# Patient Record
Sex: Male | Born: 1955 | Race: White | Hispanic: No
Health system: Southern US, Community
[De-identification: ages and names within clinical notes are randomized; demographics above are authoritative.]

---

## 2014-02-15 ENCOUNTER — Inpatient Hospital Stay: Payer: Self-pay | Admitting: Internal Medicine

## 2014-02-15 LAB — COMPREHENSIVE METABOLIC PANEL
ALK PHOS: 82 U/L
ANION GAP: 9 (ref 7–16)
AST: 35 U/L (ref 15–37)
Albumin: 4.3 g/dL (ref 3.4–5.0)
BUN: 11 mg/dL (ref 7–18)
Bilirubin,Total: 1 mg/dL (ref 0.2–1.0)
Calcium, Total: 9.3 mg/dL (ref 8.5–10.1)
Chloride: 99 mmol/L (ref 98–107)
Co2: 26 mmol/L (ref 21–32)
Creatinine: 0.94 mg/dL (ref 0.60–1.30)
EGFR (Non-African Amer.): >60
Glucose: 94 mg/dL (ref 65–99)
Osmolality: 267 (ref 275–301)
Potassium: 3.5 mmol/L (ref 3.5–5.1)
SGPT (ALT): 30 U/L (ref 12–78)
SODIUM: 134 mmol/L — AB (ref 136–145)
TOTAL PROTEIN: 8.5 g/dL — AB (ref 6.4–8.2)

## 2014-02-15 LAB — CBC
HCT: 47.1 % (ref 40.0–52.0)
HGB: 16 g/dL (ref 13.0–18.0)
MCH: 35 pg — ABNORMAL HIGH (ref 26.0–34.0)
MCHC: 33.9 g/dL (ref 32.0–36.0)
MCV: 103 fL — ABNORMAL HIGH (ref 80–100)
PLATELETS: 234 10*3/uL (ref 150–440)
RBC: 4.57 10*6/uL (ref 4.40–5.90)
RDW: 14.5 % (ref 11.5–14.5)
WBC: 4.5 10*3/uL (ref 3.8–10.6)

## 2014-02-15 LAB — LIPASE, BLOOD: Lipase: 196 U/L (ref 73–393)

## 2014-02-15 LAB — TROPONIN I

## 2014-02-15 LAB — APTT: Activated PTT: 25.6 secs (ref 23.6–35.9)

## 2014-02-15 LAB — PROTIME-INR
INR: 0.9
PROTHROMBIN TIME: 12.4 s (ref 11.5–14.7)

## 2014-02-16 LAB — CBC WITH DIFFERENTIAL/PLATELET
BASOS ABS: 0 10*3/uL (ref 0.0–0.1)
Basophil %: 0 %
EOS ABS: 0.3 10*3/uL (ref 0.0–0.7)
Eosinophil %: 2.1 %
HCT: 38.5 % — AB (ref 40.0–52.0)
HGB: 12.6 g/dL — ABNORMAL LOW (ref 13.0–18.0)
LYMPHS ABS: 0.7 10*3/uL — AB (ref 1.0–3.6)
Lymphocyte %: 5.1 %
MCH: 33.7 pg (ref 26.0–34.0)
MCHC: 32.7 g/dL (ref 32.0–36.0)
MCV: 103 fL — AB (ref 80–100)
Monocyte #: 0.3 x10 3/mm (ref 0.2–1.0)
Monocyte %: 2.1 %
Neutrophil #: 12.9 10*3/uL — ABNORMAL HIGH (ref 1.4–6.5)
Neutrophil %: 90.7 %
Platelet: 164 10*3/uL (ref 150–440)
RBC: 3.74 10*6/uL — AB (ref 4.40–5.90)
RDW: 14.2 % (ref 11.5–14.5)
WBC: 14.2 10*3/uL — ABNORMAL HIGH (ref 3.8–10.6)

## 2014-02-16 LAB — HEPARIN LEVEL (UNFRACTIONATED)
Anti-Xa(Unfractionated): <0.1 IU/mL — ABNORMAL LOW (ref 0.30–0.70)
Anti-Xa(Unfractionated): <0.1 IU/mL — ABNORMAL LOW (ref 0.30–0.70)

## 2014-02-16 LAB — BASIC METABOLIC PANEL
Anion Gap: 7 (ref 7–16)
BUN: 12 mg/dL (ref 7–18)
Calcium, Total: 8.2 mg/dL — ABNORMAL LOW (ref 8.5–10.1)
Chloride: 107 mmol/L (ref 98–107)
Co2: 23 mmol/L (ref 21–32)
Creatinine: 1.01 mg/dL (ref 0.60–1.30)
EGFR (African American): >60
EGFR (Non-African Amer.): >60
GLUCOSE: 86 mg/dL (ref 65–99)
Osmolality: 273 (ref 275–301)
Potassium: 3.9 mmol/L (ref 3.5–5.1)
SODIUM: 137 mmol/L (ref 136–145)

## 2014-02-16 LAB — APTT: Activated PTT: 32.4 secs (ref 23.6–35.9)

## 2014-02-17 LAB — HEPARIN LEVEL (UNFRACTIONATED)
ANTI-XA(UNFRACTIONATED): 0.39 [IU]/mL (ref 0.30–0.70)
Anti-Xa(Unfractionated): 0.14 IU/mL — ABNORMAL LOW (ref 0.30–0.70)

## 2014-02-18 LAB — CBC WITH DIFFERENTIAL/PLATELET
BASOS PCT: 0.2 %
Basophil #: 0 10*3/uL (ref 0.0–0.1)
Eosinophil #: 0.5 10*3/uL (ref 0.0–0.7)
Eosinophil %: 7.5 %
HCT: 39.3 % — AB (ref 40.0–52.0)
HGB: 13.6 g/dL (ref 13.0–18.0)
Lymphocyte #: 1.2 10*3/uL (ref 1.0–3.6)
Lymphocyte %: 16.7 %
MCH: 35.3 pg — AB (ref 26.0–34.0)
MCHC: 34.6 g/dL (ref 32.0–36.0)
MCV: 102 fL — ABNORMAL HIGH (ref 80–100)
MONO ABS: 0.4 x10 3/mm (ref 0.2–1.0)
MONOS PCT: 6.4 %
NEUTROS PCT: 69.2 %
Neutrophil #: 4.9 10*3/uL (ref 1.4–6.5)
PLATELETS: 143 10*3/uL — AB (ref 150–440)
RBC: 3.85 10*6/uL — ABNORMAL LOW (ref 4.40–5.90)
RDW: 14.3 % (ref 11.5–14.5)
WBC: 7 10*3/uL (ref 3.8–10.6)

## 2014-02-18 LAB — HEPARIN LEVEL (UNFRACTIONATED): Anti-Xa(Unfractionated): 0.23 IU/mL — ABNORMAL LOW (ref 0.30–0.70)

## 2014-02-19 LAB — HEPARIN LEVEL (UNFRACTIONATED)
Anti-Xa(Unfractionated): 0.25 IU/mL — ABNORMAL LOW (ref 0.30–0.70)
Anti-Xa(Unfractionated): 0.52 IU/mL (ref 0.30–0.70)

## 2014-02-19 LAB — APTT: ACTIVATED PTT: 72.7 s — AB (ref 23.6–35.9)

## 2014-02-20 LAB — CULTURE, BLOOD (SINGLE)

## 2014-12-12 NOTE — Op Note (Signed)
PATIENT NAME:  Joe Hopkins, Bandy MR#:  [id number] DATE OF BIRTH:  [birth year]  DATE OF PROCEDURE:  02/18/2014  PREOPERATIVE DIAGNOSES:  1.  Atherosclerotic occlusive disease, right renal artery.  2.  Infarction, lower pole, right renal artery secondary to atherosclerotic occlusive disease, right renal artery.  3.  Hypertension.   POSTOPERATIVE DIAGNOSES: 1.  Atherosclerotic occlusive disease, right renal artery.  2.  Infarction, lower pole, right renal artery secondary to atherosclerotic occlusive disease, right renal artery.  3.  Hypertension.   PROCEDURES PERFORMED:  1.  Abdominal aortogram.  2.  Right renal angiography, first-order catheter placement.  3.  Percutaneous transluminal angioplasty and stent placement, right renal artery.   SURGEON: Levora DredgeGregory Schnier, M.D.   SEDATION:  Versed 5 mg plus fentanyl 200 mcg administered IV. Continuous ECG, pulse oximetry, and cardiopulmonary monitoring performed throughout the entire procedure by the interventional radiology nurse. Total sedation time was 1 hour.   ACCESS: Left common femoral artery. A 6 French sheath.   CONTRAST USED: Isovue 75 mL.   FLUOROSCOPY TIME: 5.3 minutes.   INDICATIONS: [name] presented to the hospital with flank pain and was found to have infarction of his right lower pole of the kidney.  He was also noted on that study to have a high-grade atherosclerotic lesion at the origin of the renal artery. He is, therefore, undergoing treatment to prevent further damage to his kidney. Risks and benefits are reviewed. All questions answered. The patient agrees to proceed.   DESCRIPTION OF PROCEDURE: The patient is taken to the special procedure suite, placed in the supine position. After adequate sedation is achieved, the groins are prepped and draped in a sterile fashion. Ultrasound is placed in a sterile sleeve. Ultrasound utilized secondary to lack of appropriate landmarks and for vascular injury. Under direct ultrasound  visualization, the common femoral artery on the left is identified. It is echolucent and pulsatile indicating patency. Image is recorded for the permanent record, and a micropuncture needle is then used to access it after 1% lidocaine has been infiltrated into the soft tissues. Microwire followed by micro sheath, J-wire followed by a 5 French sheath. Pigtail catheter and J-wire then advanced up to the level of T12 and AP projection of the aorta is obtained. Right renal artery is identified.  High-grade stricture is noted and 4000 units of heparin given. The sheath is then upsized to a 6 [place] Ansell and a VS1 catheter is then advanced over the J-wire. J-wire is then exchanged for a Magic torque wire using the VS1 catheter and the Magic torque wire of the renal artery is selected. Angiography  in magnified views is then obtained more clearly delineating the narrowing, and the renal artery is then measured and it is reported as approximately 6 mm in diameter.   An 0.018 wire is then exchanged through VS1. The sheath is advanced up to the ostia and a 6 x 15 balloon expandable stent is prepped on the field and advanced over the wire.  It is then positioned so that approximately 2 mm are protruding into the aorta.  Several pictures are taken and magnified imaging to properly position the stent and then stent is deployed to 12 atmospheres for approximately 30 seconds. Follow-up imaging demonstrates the stent is very well positioned. It shows good apposition to the walls of the artery, and indeed does protrude approximately 2 mm into the aorta. Distally, the renal parenchyma fills nicely.   The wire is then removed. The sheath is pulled into the  external iliac. Oblique views are then obtained and a StarClose device deployed, and there were no immediate complications.   INTERPRETATION: Initial views of the renal artery on the right demonstrate a high-grade 70% to 80% lesion at the ostia, otherwise, the renal artery  on the right appears normal.  The left renal artery ostia is widely patent. Following angioplasty with stent placement as described above, with dilatation at 6 mm, there is near total resolution of the lesion.   SUMMARY: Successful renal artery angioplasty and stent placement as described above.    ____________________________ Renford Dills, MD ggs:ts D: 02/27/2014 13:45:27 ET T: 02/27/2014 14:18:35 ET JOB#: [id number]  cc: Renford Dills, MD, <Dictator> Renford Dills MD ELECTRONICALLY SIGNED 03/17/2014 12:20

## 2014-12-12 NOTE — H&P (Signed)
Subjective/Chief Complaint back pain   History of Present Illness 59 year old male with no known chronic medical conditions other than alcohol and tobacco abuse presents with sudden onset of severe back pain that started today. Patient states that he recently moved from [place]. He was hospitalized there 2 weeks ago for septic shock, bacteremia and subsequent arrest from an infection in his left toe that eventually required amputation. He states he does not know which hospital he was in at the time but on discharge he was asked to complete a course of antibiotics Clindamycin and Bactrim. He does not know if any bacteria were isolated in his blood. Today he woke up with severe, sharp, diffuse back pain that radiates up to his neck and extends all the way down to his buttock. It gets worse with movement, no alleviating factors. In ED CT showed renal infarct and he is tachycardic so hospitalist group is called for admission.  ROS Gen: no fever, chills, fatigue, weightloss Eyes: No eye pain, no blurred vision ENT: no ear pain, no tinnitus, no sorethroat Cardiac: no chest pain, no palpitations, no leg swelling Pulm: no cough, no dyspnea GI: no abdominal pain, no constipation, no diarrhea Skin: no new rashes Musc: no myalgia, no arthralgias Neuro: no headaches, no dizziness, no lightheadedness Psych: denies depression, suicidal or homicidal ideation   Past History Recent hospitalization for sepsis, toe infection and amputation Tobacco abuse, continuous Chronic daily alcohol use, continuous   Primary Physician None   ALLERGIES:  No Known Allergies:   HOME MEDICATIONS: Medication Instructions Status  clindamycin 300 mg oral capsule  orally every 8 hours Active  sulfamethoxazole-trimethoprim 800 mg-160 mg oral tablet 1 tab(s) orally 2 times a day Active   Family and Social History:  Family History Coronary Artery Disease  Hypertension  Diabetes Mellitus  Stroke  ESRD on HD father   Social  History positive  tobacco, positive ETOH, 5-7 beers daily, last drink today 2 beers. denies hx of withdrawal seizures   + Tobacco Current (within 1 year)  1PPD for 45 years   Review of Systems:  Fever/Chills No   Cough No   Sputum No   Abdominal Pain No   Diarrhea No   Constipation No   Nausea/Vomiting No   SOB/DOE No   Chest Pain No   Telemetry Reviewed sinus tachycardia   Dysuria No   Physical Exam:  GEN no acute distress, thin   HEENT PERRL, hearing intact to voice, moist oral mucosa, Oropharynx clear   NECK supple  No masses   RESP normal resp effort  clear BS   CARD No LE edema  no JVD  tachycardic   ABD denies tenderness  soft  normal BS   EXTR negative cyanosis/clubbing, negative edema, normal tone. 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, wound healed, no erythema   SKIN normal to palpation, No rashes, warm and dry   NEURO cranial nerves intact, follows commands, motor/sensory function intact   PSYCH alert, A+O to time, place, person, good insight   Lab Results:  Hepatic:  28-Jun-15 14:24   Bilirubin, Total 1.0  Alkaline Phosphatase 82 (45-117 NOTE: New Reference Range 07/11/13)  SGPT (ALT) 30  SGOT (AST) 35  Total Protein, Serum  8.5  Albumin, Serum 4.3  Routine Chem:  28-Jun-15 14:24   Glucose, Serum 94  BUN 11  Creatinine (comp) 0.94  Sodium, Serum  134  Potassium, Serum 3.5  Chloride, Serum 99  CO2, Serum 26  Calcium (Total), Serum 9.3  Osmolality (calc) 267  eGFR (African American) >60  eGFR (Non-African American) >60 (eGFR values <55m/min/1.73 m2 may be an indication of chronic kidney disease (CKD). Calculated eGFR is useful in patients with stable renal function. The eGFR calculation will not be reliable in acutely ill patients when serum creatinine is changing rapidly. It is not useful in  patients on dialysis. The eGFR calculation may not be applicable to patients at the low and high extremes of body sizes, pregnant women, and  vegetarians.)  Anion Gap 9  Lipase 196 (Result(s) reported on 15 Feb 2014 at 02:59PM.)  Cardiac:  28-Jun-15 14:24   Troponin I < 0.02 (0.00-0.05 0.05 ng/mL or less: NEGATIVE  Repeat testing in 3-6 hrs  if clinically indicated. >0.05 ng/mL: POTENTIAL  MYOCARDIAL INJURY. Repeat  testing in 3-6 hrs if  clinically indicated. NOTE: An increase or decrease  of 30% or more on serial  testing suggests a  clinically important change)  Routine Coag:  28-Jun-15 14:24   Prothrombin 12.4  INR 0.9 (INR reference interval applies to patients on anticoagulant therapy. A single INR therapeutic range for coumarins is not optimal for all indications; however, the suggested range for most indications is 2.0 - 3.0. Exceptions to the INR Reference Range may include: Prosthetic heart valves, acute myocardial infarction, prevention of myocardial infarction, and combinations of aspirin and anticoagulant. The need for a higher or lower target INR must be assessed individually. Reference: The Pharmacology and Management of the Vitamin K  antagonists: the seventh ACCP Conference on Antithrombotic and Thrombolytic Therapy. CGBTDV.7616Sept:126 (3suppl): [id number] A HCT value >55% may artifactually increase the PT.  In one study,  the increase was an average of 25%. Reference:  "Effect on Routine and Special Coagulation Testing Values of Citrate Anticoagulant Adjustment in Patients with High HCT Values." American Journal of Clinical Pathology 2006;126:400-405.)  Activated PTT (APTT) 25.6 (A HCT value >55% may artifactually increase the APTT. In one study, the increase was an average of 19%. Reference: "Effect on Routine and Special Coagulation Testing Values of Citrate Anticoagulant Adjustment in Patients with High HCT Values." American Journal of Clinical Pathology 2006;126:400-405.)  Routine Hem:  28-Jun-15 14:24   WBC (CBC) 4.5  RBC (CBC) 4.57  Hemoglobin (CBC) 16.0  Hematocrit (CBC) 47.1   Platelet Count (CBC) 234 (Result(s) reported on 15 Feb 2014 at 02:52PM.)  MCV  103  MCH  35.0  MCHC 33.9  RDW 14.5   Radiology Results: XRay:    28-Jun-15 14:59, Chest Portable Single View  Chest Portable Single View  REASON FOR EXAM:    Chest Pain  COMMENTS:       PROCEDURE: DXR - DXR PORTABLE CHEST SINGLE VIEW  - Feb 15 2014  2:59PM     CLINICAL DATA:  Back pain, sepsis    EXAM:  PORTABLE CHEST - 1 VIEW    COMPARISON:  None.    FINDINGS:  Cardiomediastinal silhouette is unremarkable. No acute infiltrate or  pulmonary edema. There is vague nodular density in left base  measures about 2 cm. Although this may be due to prior rib fracture,  a lung nodule cannot be excluded. Further correlation with CT scan  is recommended.     IMPRESSION:  No acute infiltrate or pulmonary edema. Vague nodular density left  base. A lung nodule cannot be excluded. Further evaluation with CT  scan of the chest is recommended.      Electronically Signed    By: LLahoma CrockerM.D.  On: 02/15/2014  15:15       Verified By: Ephraim Hamburger, M.D.,  CT:    28-Jun-15 16:28, CT Angiography Chest/Abd/Pelvis w/wo  CT Angiography Chest/Abd/Pelvis w/wo  REASON FOR EXAM:    severe b/l shoulder pain and upper back pain rad.   down to lower back, tachycardi  COMMENTS:       PROCEDURE: CT  - CT ANGIOGRAPHY CHEST/ABD/PELVIS  - Feb 15 2014  4:28PM     CLINICAL DATA:  Severe bilateral shoulder and upper back pain  radiating down to the lower back. Tachycardia. Recently discharged  from another hospital 2 weeks ago for sepsis and cardiac arrest.    EXAM:  CT ANGIOGRAPHY CHEST, ABDOMEN AND PELVIS    TECHNIQUE:  Multidetector CT imaging through the chest,abdomen and pelvis was  performed using the standard protocol during bolus administration of  intravenous contrast. Multiplanar reconstructed images and MIPs were  obtained and reviewed to evaluate the vascular anatomy.    CONTRAST:  100 mL Isovue  370    COMPARISON:  Chest radiograph earlier today    FINDINGS:  CTA CHEST FINDINGS    There is marked motion artifact through the aortic root and  ascending aorta. There is no evidence of thoracic aortic intramural  hematoma. No definite ascending aortic dissection is seen, however  examination is nearly nondiagnostic through the proximal ascending  aorta due to artifact and a dissection through this area cannot be  excluded. Second anterior contour lying along the anterior margin of  the aortic root and ascending aorta is felt to most likely be  artifactual, however a dissection cannot be excluded due to motion  artifact. There is no gross periaortic stranding or hematoma  identified. The aortic arch and descending thoracic aorta at are  without evidence of dissection. The aorta is normal in caliber.  Moderate aortic arch atherosclerotic calcification is noted.    The heart is normal in size. 3 vessel coronary artery calcification  is noted. No enlarged axillary, mediastinal, or hilar lymph nodes  are identified. There is no pleural or pericardial effusion. Minimal  dependent subsegmental atelectasis is present in the lower lobes,  with minimal atelectasis versus scarring in the right middle lobe  and lingula. There is a minimally displaced fracture of the left  posterior tenth rib with some callus formation. There is also a  healing left anterior fifth rib fracture.    Review of the MIP images confirms the above findings.    CTA ABDOMEN AND PELVIS FINDINGS    Abdominal aorta is normal in caliber without evidence of dissection  or aneurysm. There is advanced atherosclerosis with calcified and  noncalcified atheromatous plaque throughout its length. Focal,  severe calcification is noted at the right renal artery origin, and  there is abnormally diminished enhancement of the lower pole of the  right kidney. The celiac, superior mesenteric, and bilateral renal  arteries appear  patent. Inferior mesenteric artery appears patent,  although its origin is not well evaluated. Moderate iliac and  proximal femoral atherosclerosis is noted, with the vessels  appearing patent.    A nonobstructing 4 mm calculus is present in the lower pole of the  left kidney. The liver, spleen, left adrenal gland, and pancreas are  unremarkable for arterial phase contrast timing. 8 mm right adrenal  nodule is too small to characterize. Focal wall thickening is noted  at the gallbladder fundus.    The stomach is moderately distended with fluid. Small and large  bowel are grossly  unremarkable aside from a moderate amount of stool  throughout the colon. The bladder is unremarkable. No free fluid or  enlarged lymph nodes are identified. Mild to moderate thoracolumbar  spondylosis is noted.  Review of the MIP images confirms the above findings.     IMPRESSION:  1. No evidence of dissection or aneurysm of the aortic arch,  descending thoracic aorta, or abdominal aorta. There is marked  motion artifact through the ascending aorta, particularly at the  aortic root, and short segment dissection through this area cannot  be excluded.  2. Acute right lower pole renal infarct.  3. Moderate to advanced atherosclerotic disease of the aorta and its  major branch vessels. Three-vessel coronary artery calcification.  4. Subacute left fifth and tenth rib fractures.  5. Focal wall thickening of the gallbladder fundus, possibly focal  adenomyomatosis. Further evaluation with nonemergent right upper  quadrant ultrasound recommended after the patient's acute illness  has resolved.  6. Nonobstructing left renal calculus.      Electronically Signed    By: Logan Bores    On: 02/15/2014 17:14         Verified By: Ferol Luz, M.D.,    Assessment/Admission Diagnosis 59 year old male presenting with severe back pain, found to have renal infarct   Plan 1. Renal infarct, acute - given recent  infection, concerned for embolic source, endocarditis vs afib - blood cultures - check echocardiogram to assess for any vegetations - case was discussed with Dr. Lucky Cowboy given narrowing at the renal artery, recommend outpatient renal artery ultrasound and follow up with him as outpatient - He does have extensive atherosclerosis  2. Sinus tachycardia - ? early sepsis.  - Blood cultures, no antibiotics at this point - 3rd liter NS now, metoprolol IV x 1, place on telemetry  3. alcohol use, continous CIWA protocol check magnesium  4. Tobacco abuse, continuous - counseling provided - patch provided  5. Back pain etiology unclear, prn pain medications. Can consider MRI thoracic spine, since his symptoms are not localized   DVT proph: Lovenox  FULL CODE.  Time Spent: 50 minutes   Electronic Signatures: Samson Frederic (MD)  (Signed 28-Jun-15 21:55)  Authored: CHIEF COMPLAINT and HISTORY, ALLERGIES, HOME MEDICATIONS, FAMILY AND SOCIAL HISTORY, REVIEW OF SYSTEMS, PHYSICAL EXAM, LABS, Radiology, ASSESSMENT AND PLAN   Last Updated: 28-Jun-15 21:55 by Samson Frederic (MD)

## 2014-12-12 NOTE — Consult Note (Signed)
Brief Consult Note: Diagnosis: Infarction of the right kidney.   Patient was seen by consultant.   Recommend to proceed with surgery or procedure.   Comments: I would plan to rule out infections eitiology eg endocarditis and then proceed with PTA and Stenting of the right renal artery.  This could be done this Wednesday  if cultures are negative.  Electronic Signatures: Levora DredgeSchnier, Gregory (MD)  (Signed 29-Jun-15 15:28)  Authored: Brief Consult Note   Last Updated: 29-Jun-15 15:28 by Levora DredgeSchnier, Gregory (MD)

## 2014-12-12 NOTE — Discharge Summary (Signed)
PATIENT NAME:  Joe Hopkins, Joe Hopkins MR#:  [id number] DATE OF BIRTH:  18-Jul-1956  DATE OF ADMISSION:  02/15/2014 DATE OF DISCHARGE:  02/19/2014  DISCHARGE DIAGNOSES: 1. Sepsis, ruled out.  2. Low back pain, which is chronic.  3. Acute renal infarct status post stent.   4. Tobacco abuse.  5. Left shoulder pain which is not related to fracture.    DISCHARGE MEDICATIONS: 1. Simvastatin 10 mg p.o. daily.  2. Plavix 75 mg p.o. daily.  3. Aspirin 81 mg p.o. day.  4. Percocet 2.5/ 325 mg 1 tablets q. 6 hours p.r.n.   HOSPITAL COURSE:  89. A 59 year old male patient, felt sudden back pain and found to have a renal infarct. The patient was admitted to  ICU  for right renal infarct. The patient was given IV fluids for possible sepsis ,thought that sepsis may be causing possible embolic stroke. . The patient did not get any heparin on admission. The patient was started on heparin because of infarct in the right renals region. Vascular saw the patient and the patient had surgery for right renal infarct with angiogram and stenting for the right renal infarct. The patient did not have further back pain.   2. Sepsis. The patient was at the hospital  in [place],(please look in my  progress note for details) for 3 days and at that time, the patient felt very weak and tired, found to have sepsis and bacteremia. The patient did not have any positive cultures, but after further evaluation, they found that the patient had right leg cellulitis and was discharged home with Bactrim and clindamycin. The patient continued on Bactrim and clindamycin. He finished the Bactrim and clindamycin. Here, the patient's cultures have been negative and he did not have any fever. The patient's echo did not show any vegetations. He did not have any atrial fibrillation, so we continued the antibiotics for infection that he had and the patient thought to have right leg cellulitis which improved with antibiotics alone. The patient's CT angiogram of  the chest did not show any pulmonary emboli and also it  showed only right pole of the right renal infarct and the patient's CBC met-B were within normal limits and the patient was started on anticoagulation after CT head was negative.  The patient 's discharge vital signs were stable at heart rate 79, blood pressure 118/69, temperature 97.6. The patient had aspirin and Plavix started for arthrosclerosis. The patient wanted help with arranging transport and also medications. We gave prescriptions and also the patient states that he just moved from [place]. He does not have a job or income, so we gave him an appointment with [facility] and also case manager given the phone number [facility].   TIME SPENT ON DISCHARGE PREPARATION: More than 30 minutes.   Physical examination was normal. At time of discharge, the only complaint he had was left shoulder pain and decreased range of motion of left shoulder that, so we got an x-ray of the left shoulder which did not show any fracture, so we gave him Percocet to go home with. At some point, he may need an orthopedic evaluation to see if she had any bursitis.     ____________________________ Epifanio Lesches, MD sk:lm D: 02/20/2014 12:27:39 ET T: 02/21/2014 00:13:17 ET JOB#: [id number]  cc: Epifanio Lesches, MD, <Dictator> Epifanio Lesches MD ELECTRONICALLY SIGNED 03/05/2014 19:44 Epifanio Lesches MD ELECTRONICALLY SIGNED 03/05/2014 19:50

## 2015-08-14 IMAGING — CT CT ANGIO CHEST-ABD-PELV
2 of 6 series · 12 of 46 positions shown, 14 images · IV contrast (APPLIED)
Comparison: Chest radiograph earlier today

CLINICAL DATA: Severe bilateral shoulder and upper back pain
radiating down to the lower back. Tachycardia. Recently discharged
from another hospital 2 weeks ago for sepsis and cardiac arrest.

EXAM:
CT ANGIOGRAPHY CHEST, ABDOMEN AND PELVIS
TECHNIQUE: Multidetector CT imaging through the chest, abdomen and pelvis was
performed using the standard protocol during bolus administration of
intravenous contrast. Multiplanar reconstructed images and MIPs were
obtained and reviewed to evaluate the vascular anatomy.
CONTRAST:  100 mL Isovue 370

[Series 6: cta chest · axial · 0.68mm/px · z∈[-620,-70]mm · 9 of 327 slices shown, 11 images]
[im 26/327  soft-tissue]
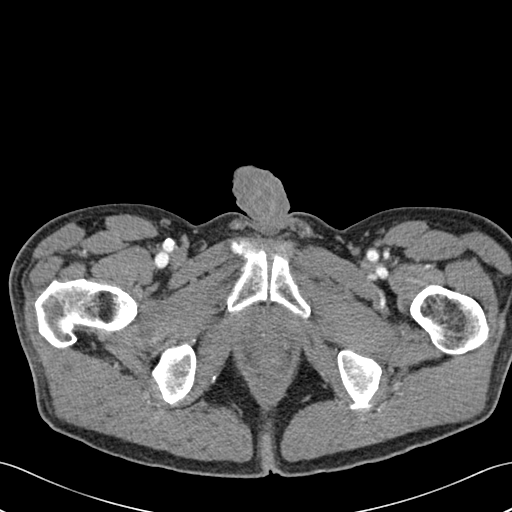
[im 26/327  bone]
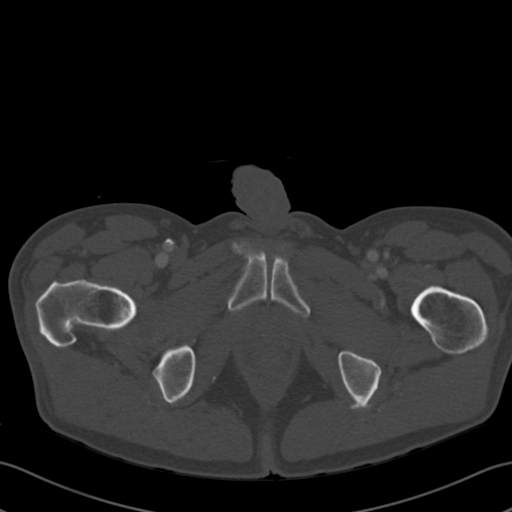
[im 63/327  soft-tissue]
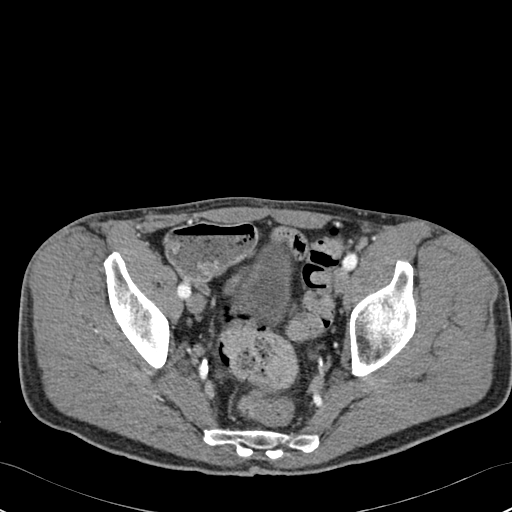
[im 88/327  soft-tissue]
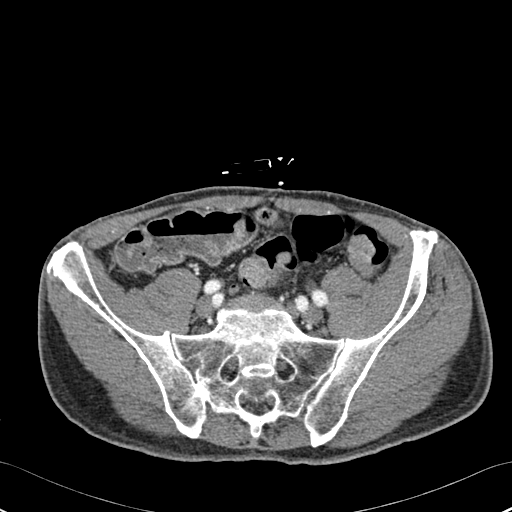
[im 126/327  soft-tissue]
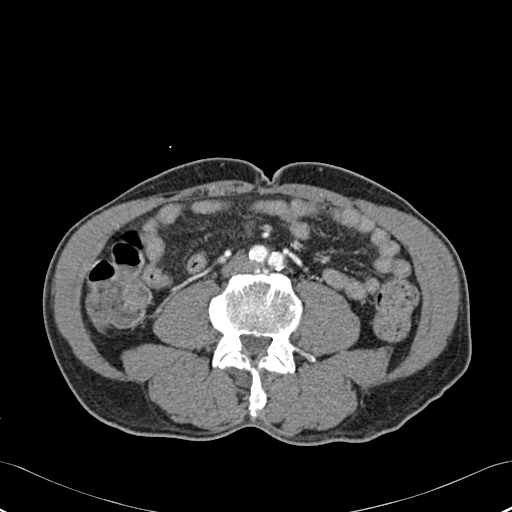
[im 164/327  soft-tissue]
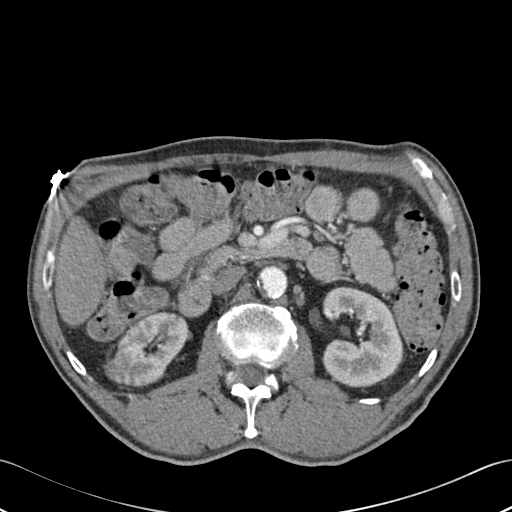
[im 201/327  soft-tissue]
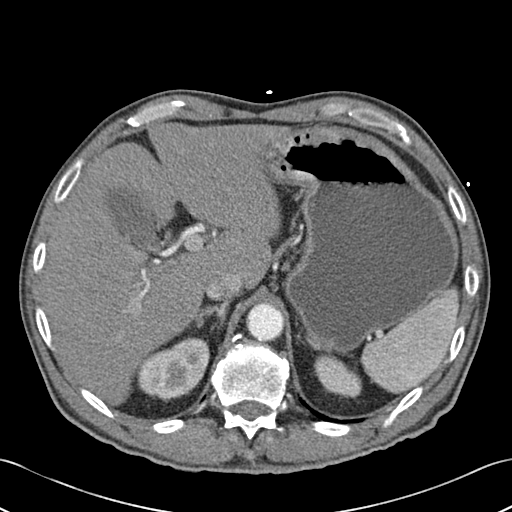
[im 239/327  soft-tissue]
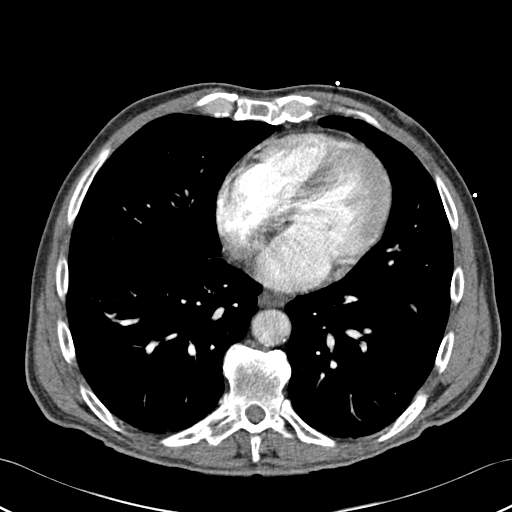
[im 264/327  soft-tissue]
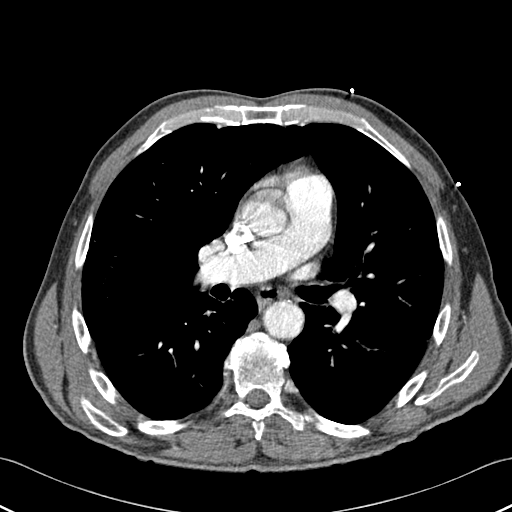
[im 301/327  soft-tissue]
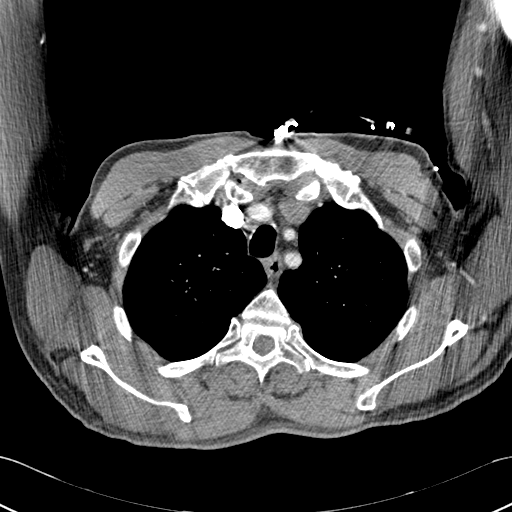
[im 301/327  bone]
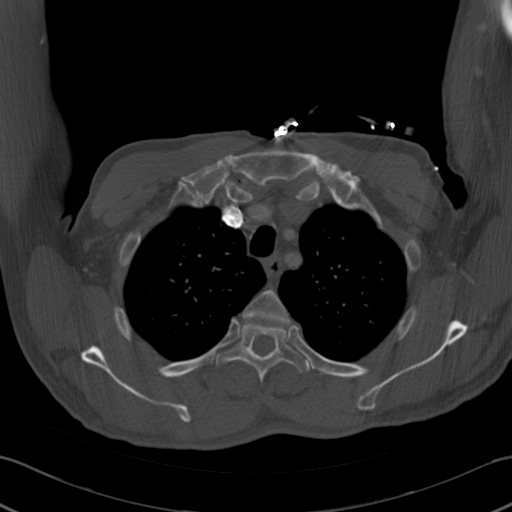

[Series 7: cor cta chest mpr · coronal · 0.61mm/px · 3 of 127 slices shown]
[im 32/127  soft-tissue]
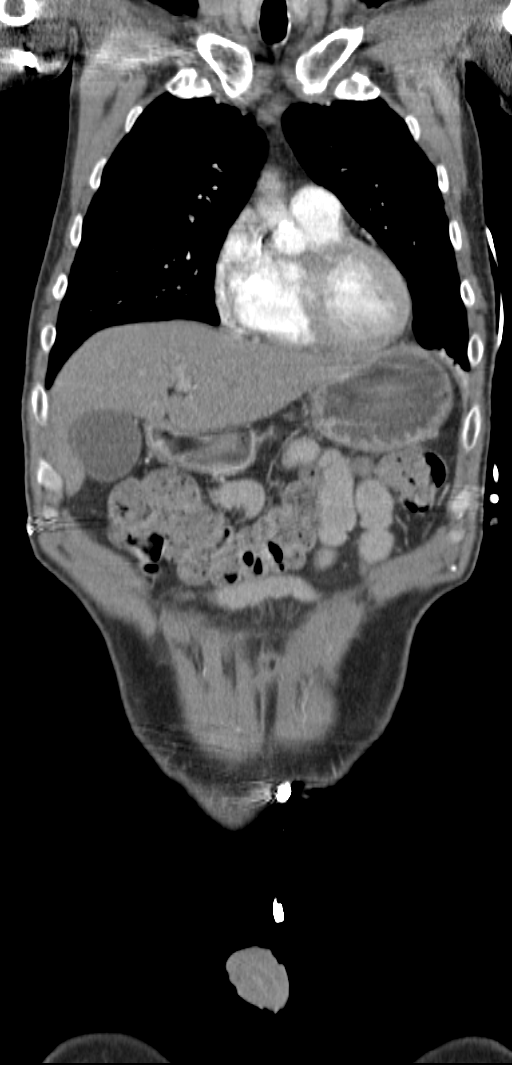
[im 64/127  soft-tissue]
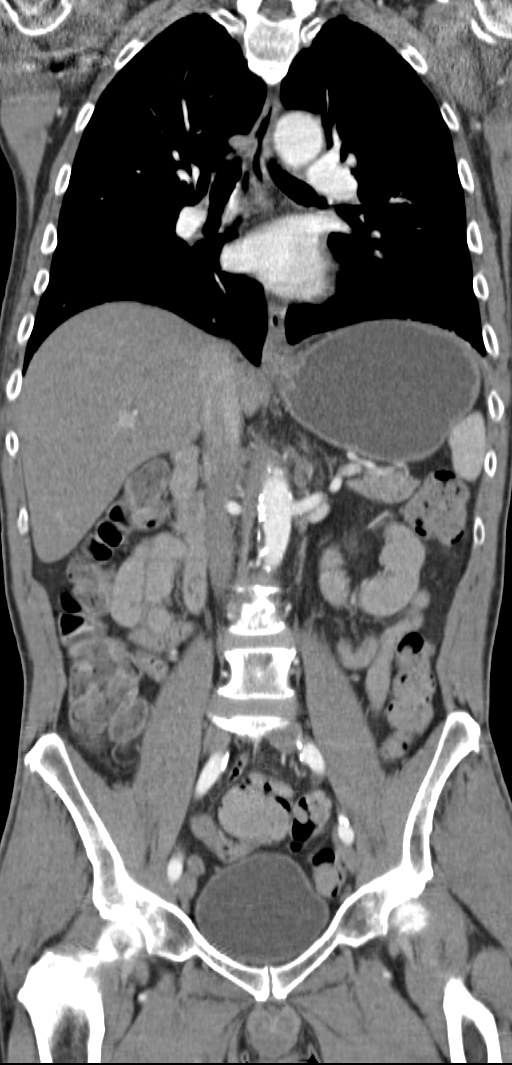
[im 95/127  soft-tissue]
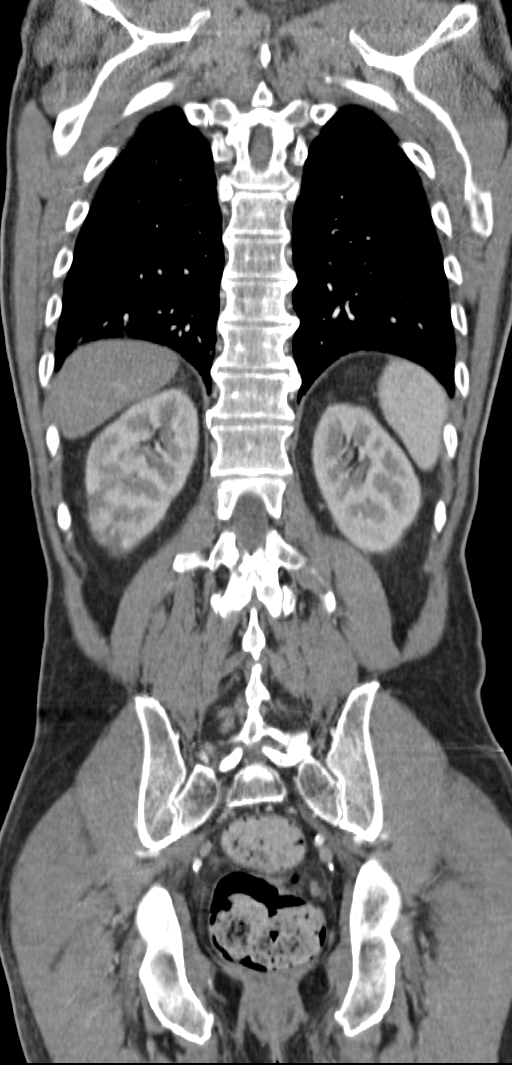

[12 of 46 positions shown; findings below may reference images not displayed]

FINDINGS: CTA CHEST FINDINGS

There is marked motion artifact through the aortic root and
ascending aorta. There is no evidence of thoracic aortic intramural
hematoma. No definite ascending aortic dissection is seen, however
examination is nearly nondiagnostic through the proximal ascending
aorta due to artifact and a dissection through this area cannot be
excluded. Second anterior contour lying along the anterior margin of
the aortic root and ascending aorta is felt to most likely be
artifactual, however a dissection cannot be excluded due to motion
artifact. There is no gross periaortic stranding or hematoma
identified. The aortic arch and descending thoracic aorta at are
without evidence of dissection. The aorta is normal in caliber.
Moderate aortic arch atherosclerotic calcification is noted.

The heart is normal in size. 3 vessel coronary artery calcification
is noted. No enlarged axillary, mediastinal, or hilar lymph nodes
are identified. There is no pleural or pericardial effusion. Minimal
dependent subsegmental atelectasis is present in the lower lobes,
with minimal atelectasis versus scarring in the right middle lobe
and lingula. There is a minimally displaced fracture of the left
posterior tenth rib with some callus formation. There is also a
healing left anterior fifth rib fracture.

Review of the MIP images confirms the above findings.

CTA ABDOMEN AND PELVIS FINDINGS

Abdominal aorta is normal in caliber without evidence of dissection
or aneurysm. There is advanced atherosclerosis with calcified and
noncalcified atheromatous plaque throughout its length. Focal,
severe calcification is noted at the right renal artery origin, and
there is abnormally diminished enhancement of the lower pole of the
right kidney. The celiac, superior mesenteric, and bilateral renal
arteries appear patent. Inferior mesenteric artery appears patent,
although its origin is not well evaluated. Moderate iliac and
proximal femoral atherosclerosis is noted, with the vessels
appearing patent.

A nonobstructing 4 mm calculus is present in the lower pole of the
left kidney. The liver, spleen, left adrenal gland, and pancreas are
unremarkable for arterial phase contrast timing. 8 mm right adrenal
nodule is too small to characterize. Focal wall thickening is noted
at the gallbladder fundus.

The stomach is moderately distended with fluid. Small and large
bowel are grossly unremarkable aside from a moderate amount of stool
throughout the colon. The bladder is unremarkable. No free fluid or
enlarged lymph nodes are identified. Mild to moderate thoracolumbar
spondylosis is noted.

Review of the MIP images confirms the above findings.
IMPRESSION: 1. No evidence of dissection or aneurysm of the aortic arch,
descending thoracic aorta, or abdominal aorta. There is marked
motion artifact through the ascending aorta, particularly at the
aortic root, and short segment dissection through this area cannot
be excluded.
2. Acute right lower pole renal infarct.
3. Moderate to advanced atherosclerotic disease of the aorta and its
major branch vessels. Three-vessel coronary artery calcification.
4. Subacute left fifth and tenth rib fractures.
5. Focal wall thickening of the gallbladder fundus, possibly focal
adenomyomatosis. Further evaluation with nonemergent right upper
quadrant ultrasound recommended after the patient's acute illness
has resolved.
6. Nonobstructing left renal calculus.
